# Patient Record
Sex: Female | Born: 1985 | Race: Black or African American | Hispanic: No | Marital: Single | Smoking: Never smoker
Health system: Southern US, Community
[De-identification: ages and names within clinical notes are randomized; demographics above are authoritative.]

## PROBLEM LIST (undated history)

## (undated) ENCOUNTER — Inpatient Hospital Stay (HOSPITAL_COMMUNITY): Payer: Self-pay

## (undated) DIAGNOSIS — Z789 Other specified health status: Secondary | ICD-10-CM

## (undated) HISTORY — PX: DILATION AND CURETTAGE OF UTERUS: SHX78

---

## 2009-07-06 ENCOUNTER — Emergency Department (HOSPITAL_COMMUNITY): Admission: EM | Admit: 2009-07-06 | Discharge: 2009-07-06 | Payer: Self-pay | Admitting: Emergency Medicine

## 2011-02-01 ENCOUNTER — Emergency Department (HOSPITAL_COMMUNITY)
Admission: EM | Admit: 2011-02-01 | Discharge: 2011-02-01 | Disposition: A | Discharge status: Home / Self Care | Payer: Medicaid Other | Attending: Emergency Medicine | Admitting: Emergency Medicine

## 2011-02-01 DIAGNOSIS — O219 Vomiting of pregnancy, unspecified: Secondary | ICD-10-CM | POA: Insufficient documentation

## 2011-02-01 LAB — URINALYSIS, ROUTINE W REFLEX MICROSCOPIC
Nitrite: NEGATIVE
Specific Gravity, Urine: 1.044 — ABNORMAL HIGH (ref 1.005–1.030)
pH: 6 (ref 5.0–8.0)

## 2011-02-01 LAB — POCT I-STAT, CHEM 8
BUN: 8 mg/dL (ref 6–23)
Calcium, Ion: 1.23 mmol/L (ref 1.12–1.32)
Chloride: 100 mEq/L (ref 96–112)
Creatinine, Ser: 0.7 mg/dL (ref 0.50–1.10)
Glucose, Bld: 75 mg/dL (ref 70–99)
HCT: 43 % (ref 36.0–46.0)
Hemoglobin: 14.6 g/dL (ref 12.0–15.0)
Potassium: 3.2 mEq/L — ABNORMAL LOW (ref 3.5–5.1)
Sodium: 135 mEq/L (ref 135–145)
TCO2: 24 mmol/L (ref 0–100)

## 2011-02-01 LAB — URINE MICROSCOPIC-ADD ON

## 2011-02-01 LAB — POCT PREGNANCY, URINE: Preg Test, Ur: POSITIVE

## 2011-02-03 ENCOUNTER — Emergency Department (HOSPITAL_COMMUNITY): Payer: Medicaid Other

## 2011-02-03 ENCOUNTER — Observation Stay (HOSPITAL_COMMUNITY)
Admission: EM | Admit: 2011-02-03 | Discharge: 2011-02-04 | Disposition: A | Discharge status: Home / Self Care | Payer: Medicaid Other | Attending: Emergency Medicine | Admitting: Emergency Medicine

## 2011-02-03 DIAGNOSIS — A499 Bacterial infection, unspecified: Secondary | ICD-10-CM | POA: Insufficient documentation

## 2011-02-03 DIAGNOSIS — O2390 Unspecified genitourinary tract infection in pregnancy, unspecified trimester: Secondary | ICD-10-CM | POA: Insufficient documentation

## 2011-02-03 DIAGNOSIS — B9689 Other specified bacterial agents as the cause of diseases classified elsewhere: Secondary | ICD-10-CM | POA: Insufficient documentation

## 2011-02-03 DIAGNOSIS — O210 Mild hyperemesis gravidarum: Principal | ICD-10-CM | POA: Insufficient documentation

## 2011-02-03 DIAGNOSIS — N760 Acute vaginitis: Secondary | ICD-10-CM | POA: Insufficient documentation

## 2011-02-03 LAB — POCT I-STAT, CHEM 8
Calcium, Ion: 1.18 mmol/L (ref 1.12–1.32)
Chloride: 102 mEq/L (ref 96–112)
Glucose, Bld: 59 mg/dL — ABNORMAL LOW (ref 70–99)
HCT: 36 % (ref 36.0–46.0)
Hemoglobin: 12.2 g/dL (ref 12.0–15.0)
TCO2: 17 mmol/L (ref 0–100)

## 2011-02-03 LAB — WET PREP, GENITAL
Trich, Wet Prep: NONE SEEN
Yeast Wet Prep HPF POC: NONE SEEN

## 2011-02-03 LAB — URINALYSIS, ROUTINE W REFLEX MICROSCOPIC
Bilirubin Urine: NEGATIVE
Hgb urine dipstick: NEGATIVE
Ketones, ur: >80 mg/dL — AB
Nitrite: NEGATIVE
Urobilinogen, UA: 1 mg/dL (ref 0.0–1.0)

## 2011-02-03 LAB — HCG, QUANTITATIVE, PREGNANCY: hCG, Beta Chain, Quant, S: 38378 m[IU]/mL — ABNORMAL HIGH (ref <5)

## 2011-02-04 LAB — GC/CHLAMYDIA PROBE AMP, GENITAL
Chlamydia, DNA Probe: NEGATIVE
GC Probe Amp, Genital: NEGATIVE

## 2011-04-21 NOTE — L&D Delivery Note (Signed)
Delivery Note At  a viable female was delivered via NSVD (Presentation: ROA ;  ).  APGAR: 9-9 , ; weight .   Placenta status: Intact , .  Cord: 3 vessel  with the following complications: None .  Cord pH: none  Anesthesia: Epidural  Episiotomy: None Lacerations: 2nd Degree Suture Repair: 2.0 chromic Est. Blood Loss (mL): 350  Mom to postpartum.  Baby to nursery-stable.  Salene Mohamud A 09/30/2011, 1:36 AM

## 2011-06-24 ENCOUNTER — Other Ambulatory Visit: Payer: Self-pay | Admitting: Obstetrics & Gynecology

## 2011-06-24 DIAGNOSIS — Z3689 Encounter for other specified antenatal screening: Secondary | ICD-10-CM

## 2011-06-24 DIAGNOSIS — O0930 Supervision of pregnancy with insufficient antenatal care, unspecified trimester: Secondary | ICD-10-CM

## 2011-06-24 LAB — CBC
HCT: 30 % — AB (ref 36–46)
Hemoglobin: 9.6 g/dL — AB (ref 12.0–16.0)
Platelets: 305 10*3/uL (ref 150–399)

## 2011-06-24 LAB — RUBELLA ANTIBODY, IGM: Rubella: IMMUNE

## 2011-06-24 LAB — GC/CHLAMYDIA PROBE AMP, GENITAL: Gonorrhea: NEGATIVE

## 2011-06-25 ENCOUNTER — Observation Stay (HOSPITAL_COMMUNITY)
Admission: AD | Admit: 2011-06-25 | Discharge: 2011-06-29 | Disposition: A | Discharge status: Home / Self Care | Payer: Medicaid Other | Source: Ambulatory Visit | Attending: Obstetrics | Admitting: Obstetrics

## 2011-06-25 ENCOUNTER — Encounter (HOSPITAL_COMMUNITY): Payer: Self-pay | Admitting: *Deleted

## 2011-06-25 ENCOUNTER — Other Ambulatory Visit: Payer: Self-pay | Admitting: Obstetrics & Gynecology

## 2011-06-25 ENCOUNTER — Ambulatory Visit (HOSPITAL_COMMUNITY)
Admission: RE | Admit: 2011-06-25 | Discharge: 2011-06-25 | Disposition: A | Discharge status: Home / Self Care | Payer: Medicaid Other | Source: Ambulatory Visit | Attending: Obstetrics & Gynecology | Admitting: Obstetrics & Gynecology

## 2011-06-25 DIAGNOSIS — A499 Bacterial infection, unspecified: Secondary | ICD-10-CM | POA: Insufficient documentation

## 2011-06-25 DIAGNOSIS — O4700 False labor before 37 completed weeks of gestation, unspecified trimester: Principal | ICD-10-CM | POA: Insufficient documentation

## 2011-06-25 DIAGNOSIS — B9689 Other specified bacterial agents as the cause of diseases classified elsewhere: Secondary | ICD-10-CM | POA: Insufficient documentation

## 2011-06-25 DIAGNOSIS — Z3689 Encounter for other specified antenatal screening: Secondary | ICD-10-CM

## 2011-06-25 DIAGNOSIS — O0930 Supervision of pregnancy with insufficient antenatal care, unspecified trimester: Secondary | ICD-10-CM

## 2011-06-25 DIAGNOSIS — O09219 Supervision of pregnancy with history of pre-term labor, unspecified trimester: Secondary | ICD-10-CM

## 2011-06-25 DIAGNOSIS — O358XX0 Maternal care for other (suspected) fetal abnormality and damage, not applicable or unspecified: Secondary | ICD-10-CM | POA: Insufficient documentation

## 2011-06-25 DIAGNOSIS — Z363 Encounter for antenatal screening for malformations: Secondary | ICD-10-CM | POA: Insufficient documentation

## 2011-06-25 DIAGNOSIS — O2390 Unspecified genitourinary tract infection in pregnancy, unspecified trimester: Secondary | ICD-10-CM | POA: Insufficient documentation

## 2011-06-25 DIAGNOSIS — Z1389 Encounter for screening for other disorder: Secondary | ICD-10-CM | POA: Insufficient documentation

## 2011-06-25 DIAGNOSIS — N760 Acute vaginitis: Secondary | ICD-10-CM | POA: Insufficient documentation

## 2011-06-25 HISTORY — DX: Other specified health status: Z78.9

## 2011-06-25 LAB — CBC
HCT: 30 % — ABNORMAL LOW (ref 36.0–46.0)
MCHC: 33 g/dL (ref 30.0–36.0)
MCV: 75.9 fL — ABNORMAL LOW (ref 78.0–100.0)
RDW: 13.1 % (ref 11.5–15.5)
WBC: 6.4 10*3/uL (ref 4.0–10.5)

## 2011-06-25 MED ORDER — ZOLPIDEM TARTRATE 10 MG PO TABS: 10.0000 mg | ORAL_TABLET | Freq: Every evening | ORAL | Status: DC | PRN

## 2011-06-25 MED ORDER — ACETAMINOPHEN 325 MG PO TABS: 650.0000 mg | ORAL_TABLET | ORAL | Status: DC | PRN

## 2011-06-25 MED ORDER — MAGNESIUM SULFATE 40 G IN LACTATED RINGERS - SIMPLE
4.0000 g | Freq: Once | INTRAVENOUS | Status: AC
  Administered 2011-06-25: 4 g via INTRAVENOUS
  Filled 2011-06-25: qty 500

## 2011-06-25 MED ORDER — GENTAMICIN SULFATE 40 MG/ML IJ SOLN
Freq: Three times a day (TID) | INTRAVENOUS | Status: AC
  Administered 2011-06-25 – 2011-06-28 (×10): via INTRAVENOUS
  Filled 2011-06-25 (×10): qty 5

## 2011-06-25 MED ORDER — PRENATAL MULTIVITAMIN CH
1.0000 | ORAL_TABLET | Freq: Every day | ORAL | Status: DC
  Administered 2011-06-25 – 2011-06-27 (×3): 1 via ORAL
  Filled 2011-06-25 (×3): qty 1

## 2011-06-25 MED ORDER — DOCUSATE SODIUM 100 MG PO CAPS
100.0000 mg | ORAL_CAPSULE | Freq: Every day | ORAL | Status: DC
  Administered 2011-06-25 – 2011-06-27 (×3): 100 mg via ORAL
  Filled 2011-06-25 (×3): qty 1

## 2011-06-25 MED ORDER — CLINDAMYCIN PHOSPHATE 900 MG/50ML IV SOLN: 900.0000 mg | Freq: Three times a day (TID) | INTRAVENOUS | Status: DC

## 2011-06-25 MED ORDER — LACTATED RINGERS IV SOLN
INTRAVENOUS | Status: DC
  Administered 2011-06-25 – 2011-06-27 (×6): via INTRAVENOUS
  Administered 2011-06-28: 125 mL/h via INTRAVENOUS
  Administered 2011-06-29: 03:00:00 via INTRAVENOUS

## 2011-06-25 MED ORDER — CALCIUM CARBONATE ANTACID 500 MG PO CHEW: 2.0000 | CHEWABLE_TABLET | ORAL | Status: DC | PRN

## 2011-06-25 MED ORDER — MAGNESIUM SULFATE 40 G IN LACTATED RINGERS - SIMPLE
2.0000 g/h | INTRAVENOUS | Status: DC
  Administered 2011-06-26 – 2011-06-27 (×2): 2 g/h via INTRAVENOUS
  Filled 2011-06-25 (×3): qty 500

## 2011-06-25 NOTE — ED Provider Notes (Signed)
See Admission H&P for short cx/funnelling at [redacted] wks GA

## 2011-06-25 NOTE — Progress Notes (Signed)
ANTIBIOTIC CONSULT NOTE - INITIAL  Pharmacy Consult for Gentamicin Indication:  PTL r/o infection  No Known Allergies  Patient Measurements: Height: 5\' 11"  (180.3 cm) Weight: 178 lb (80.74 kg) IBW/kg (Calculated) : 70.8  Dosing weight:  80.74 kg  Vital Signs: Temp: 98.5 F (36.9 C) (03/07 1243) Temp src: Oral (03/07 1243) BP: 121/71 mmHg (03/07 1243) Pulse Rate: 76  (03/07 1243)  Labs: No SCr available.  Will assume normal renal function and use SCr 0.7 mg/dL and an estimated CrCL >454 mL/min.  Medications:  Clindamycin 900 mg IV q8h  Assessment: 25yoF at [redacted] weeks gestation with PTL. Estimated Ke = 0.413, Vd = 28.2 L  Goal of Therapy:  Gentamicin peak 6-8 mg/L and Trough < 1 mg/L  Plan:  Gentamicin 200 mg IV every 8 hrs  Check Scr with next labs if gentamicin continued. Will check gentamicin levels if continued > 72hr or clinically indicated.  Clearnce Sorrel 06/25/2011,2:49 PM

## 2011-06-25 NOTE — H&P (Signed)
Kelly Velez is a 26 y.o. female presenting for further evaluation and treatment of threatened PTL. Maternal Medical History:  Reason for admission: Sent from [place] where she was having routine anatomic screen and noted to have short cx and funnelling of membranes.  Fetal activity: Perceived fetal activity is normal.    Prenatal complications: Late to care with NOB visit 2 days ago. States she was diagnosed with BV but has not yet filled Rx.  Prenatal Complications - Diabetes: none.    OB History    Grav Para Term Preterm Abortions TAB SAB Ect Mult Living   2 0 0 0 1 1 0 0 0 0      Past Medical History  Diagnosis Date  . No pertinent past medical history    Past Surgical History  Procedure Date  . Dilation and curettage of uterus    Family History: family history is negative for Anesthesia problems. Social History:  reports that she has never smoked. She does not have any smokeless tobacco history on file. She reports that she does not drink alcohol or use illicit drugs.  Review of Systems  Constitutional: Negative.   Eyes: Negative for blurred vision.  Respiratory: Negative for cough.   Cardiovascular: Negative for chest pain.  Gastrointestinal: Negative for vomiting.  Genitourinary: Negative for dysuria.  Skin: Negative for rash.  Neurological: Negative for headaches.  Psychiatric/Behavioral: Negative for depression.    Dilation: Closed Effacement (%): 30 Station: -3 Exam by:: D. Joelys Staubs, CNM Blood pressure 121/71, pulse 76, temperature 98.5 F (36.9 C), temperature source Oral, resp. rate 18, height 5\' 11"  (1.803 m), weight 80.74 kg (178 lb). Maternal Exam:  Uterine Assessment: Contraction strength is mild.  Contraction duration is 30 seconds. Contraction frequency is rare.   Abdomen: Fundal height is at costal margin.   Fetal presentation: vertex  Introitus: Normal vulva. Normal vagina.  Ferning test: not done.  Nitrazine test: not done.  Pelvis: adequate for  delivery.   Cervix: Cervix evaluated by digital exam.   Posterior, closed 2.5 cm long, high  Fetal Exam Fetal Monitor Review: Mode: ultrasound.   Variability: moderate (6-25 bpm).   Pattern: accelerations present and no decelerations.    Fetal State Assessment: Category I - tracings are normal.     Physical Exam  Constitutional: She is oriented to person, place, and time. She appears well-developed and well-nourished. No distress.  HENT:  Head: Normocephalic.  Eyes: Pupils are equal, round, and reactive to light.  Neck: Neck supple. No thyromegaly present.  Cardiovascular: Normal rate, regular rhythm and normal heart sounds.   Respiratory: Effort normal and breath sounds normal. No respiratory distress.  GI: She exhibits no distension. There is no tenderness.  Genitourinary: Vagina normal and uterus normal.  Musculoskeletal: Normal range of motion. She exhibits no edema and no tenderness.  Neurological: She is alert and oriented to person, place, and time.  Skin: Skin is warm and dry.  Psychiatric: She has a normal mood and affect.     Prenatal labs: done at [facility] ABO, Rh:   Antibody:   Rubella:   RPR:    HBsAg:    HIV:    GBS:     Assessment/Plan: Essential primigravida with shortened cx/funnelling. BV by hx Consulted Dr. Clearance Coots who plans to admit to AN Unit   Kelly Velez 06/25/2011, 1:48 PM

## 2011-06-25 NOTE — Progress Notes (Signed)
Pt sent over from ultrasound due to shortened cervix of 1.5 cm.  Pt denies any abdominal pain or bleeding.  + FM.

## 2011-06-26 LAB — BASIC METABOLIC PANEL
BUN: 6 mg/dL (ref 6–23)
CO2: 23 mEq/L (ref 19–32)
GFR calc non Af Amer: >90 mL/min (ref >90)
Glucose, Bld: 92 mg/dL (ref 70–99)
Potassium: 3.3 mEq/L — ABNORMAL LOW (ref 3.5–5.1)

## 2011-06-26 NOTE — Progress Notes (Signed)
Patient ID: Kelly Velez, female   DOB: Sep 16, 1985, 26 y.o.   MRN: [id number] Hospital Day # 1  S: Preterm labor symptoms: none.  Ultrasound reveals funneling of cervix down to ~ 1cm of measurable cervix.  O: Blood pressure 95/56, pulse 88, temperature 98.6 F (37 C), temperature source Oral, resp. rate 18, height 5\' 11"  (1.803 m), weight 178 lb (80.74 kg).   YQM:VHQIONGE: 150 bpm Toco: None XBM:[id number]: Closed Effacement (%): 30 Cervical Position: Posterior Station: -3 Exam by:: D. Poe, CNM  A/P- 27 y.o. admitted with:  Preterm cervical changes on ultrasound,  but no uterine contractions.  Patient Active Hospital Problem List: No active hospital problems.   Pregnancy Complications: preterm labor  with preterm cervical changes Preterm labor management: IV D5LR started, pelvic rest advised, modified bedrest advised, discontinue work, preterm labor education provided and magnesium sulfate prophylaxis,  and antibiotic prophylaxis x 72 hours.   Dating:  [redacted]w[redacted]d PNL Needed:  none FWB:  good PTL:  stable ROD: n/a

## 2011-06-26 NOTE — H&P (Signed)
Kelly Velez is a 26 y.o. female presenting for ultrasound evaluation and had preterm cervical changes on ultrasound.. Maternal Medical History:  Fetal activity: Perceived fetal activity is normal.   Last perceived fetal movement was within the past hour.    Prenatal Complications - Diabetes: none.    OB History    Grav Para Term Preterm Abortions TAB SAB Ect Mult Living   3 0 0 0 2 2 0 0 0 0      Past Medical History  Diagnosis Date  . No pertinent past medical history    Past Surgical History  Procedure Date  . Dilation and curettage of uterus    Family History: family history is negative for Anesthesia problems. Social History:  reports that she has never smoked. She does not have any smokeless tobacco history on file. She reports that she does not drink alcohol or use illicit drugs.  Review of Systems  All other systems reviewed and are negative.    Dilation: Closed Effacement (%): 30 Station: -3 Exam by:: D. Poe, CNM Blood pressure 95/56, pulse 88, temperature 98.6 F (37 C), temperature source Oral, resp. rate 18, height 5\' 11"  (1.803 m), weight 178 lb (80.74 kg). Maternal Exam:  Abdomen: Patient reports no abdominal tenderness. Introitus: Normal vulva. Normal vagina.  Cervix: Cervix evaluated by digital exam.     Physical Exam  Nursing note and vitals reviewed. Constitutional: She is oriented to person, place, and time. She appears well-developed and well-nourished.  HENT:  Head: Normocephalic and atraumatic.  Eyes: Conjunctivae are normal. Pupils are equal, round, and reactive to light.  Neck: Normal range of motion. Neck supple.  Cardiovascular: Normal rate and regular rhythm.   Respiratory: Effort normal.  GI: Soft.  Genitourinary: Vagina normal and uterus normal.  Musculoskeletal: Normal range of motion.  Neurological: She is alert and oriented to person, place, and time.  Skin: Skin is warm and dry.  Psychiatric: She has a normal mood and affect. Her  behavior is normal. Judgment and thought content normal.    Prenatal labs: ABO, Rh: A/Positive/-- (03/05 1700) Antibody: Negative (03/05 1700) Rubella: Immune (03/06 0247) RPR: Nonreactive (03/06 0247)  HBsAg: Negative (03/06 0247)  HIV: Non-reactive (03/06 0247)  GBS:     Assessment/Plan: 27 weeks.  Preterm cervical changes on ultrasound with funneling down to ~ 1cm during realtime scan.  Admit.  Magnesium sulfate and Gent./Clinda. Prophylaxis x 72 hours.  Repeat ultrasound for cervical measurement on 06-29-11.  Cordaryl Decelles A 06/26/2011, 1:37 PM

## 2011-06-26 NOTE — Progress Notes (Signed)
UR Chart review completed.  

## 2011-06-27 NOTE — Progress Notes (Signed)
Patient ID: Kelly Velez, female   DOB: Mar 03, 1986, 26 y.o.   MRN: [id number] Vital signs normal No contractions status unchanged

## 2011-06-28 NOTE — Progress Notes (Signed)
Time changed to daylight savings time 

## 2011-06-28 NOTE — Progress Notes (Signed)
Patient ID: Kelly Velez, female   DOB: June 29, 1985, 26 y.o.   MRN: [id number] Postpartum day one Vital signs normal Fundus firm Lochia moderate Legs negative  Doing well

## 2011-06-29 MED ORDER — PROGESTERONE MICRONIZED 200 MG PO CAPS: 200.0000 mg | ORAL_CAPSULE | Freq: Every day | ORAL | Status: DC

## 2011-06-29 MED ORDER — DSS 100 MG PO CAPS: 100.0000 mg | ORAL_CAPSULE | Freq: Every day | ORAL | Status: DC

## 2011-06-29 NOTE — Discharge Summary (Signed)
Physician Discharge Summary  Patient ID: Kelly Velez MRN: [id number] DOB/AGE: 1985/06/01 26 y.o.  Admit date: 06/25/2011 Discharge date: 06/29/2011  Admission Diagnoses:  Discharge Diagnoses:  Active Problems:  * No active hospital problems. *    Discharged Condition: good  Hospital Course: Admitted for preterm cervical changes.  Responded well to bedrest.  Consults: None  Significant Diagnostic Studies: radiology: Ultrasound: Cervical funneling to ~ 1 cm.  Treatments: IV hydration and bedrest.  Discharge Exam: Blood pressure 104/56, pulse 72, temperature 98.7 F (37.1 C), temperature source Oral, resp. rate 18, height 5\' 11"  (1.803 m), weight 178 lb (80.74 kg), SpO2 100.00%. Cervix:  FT/50%  Disposition: 01-Home or Self Care  Discharge Orders    Future Orders Please Complete By Expires   Discharge instructions      Comments:   PTL precautions.   PRETERM LABOR:  Includes any of the follwing symptoms that occur between 20 - [redacted] weeks gestation.  If these symptoms are not stopped, preterm labor can result in preterm delivery, placing your baby at risk      Notify physician for menstrual like cramps      Notify physician for uterine contractions.  These may be painless and feel like the uterus is tightening or the baby is  "balling up"      Notify physician for low, dull backache, unrelieved by heat or Tylenol      Notify physician for intestinal cramps, with or without diarrhea, sometimes described as "gas pain"      Notify physician for pelvic pressure      Notify physician for increase or change in vaginal discharge      Notify physician for vaginal bleeding      Notify physician for a general feeling that "something is not right"      Notify physician for leaking of fluid      Discharge activity:  Up to eat      Discharge diet:      Do not have sex or do anything that might make you have an orgasm      Discharge patient      HIV antibody      Comments:   This  external order was created through the Results Console.   GC/chlamydia probe amp, genital      Comments:   This external order was created through the Results Console.   CBC      Comments:   This external order was created through the Results Console.   Rubella antibody, IgM      Comments:   This external order was created through the Results Console.   Hepatitis B surface antigen      Comments:   This external order was created through the Results Console.   RPR      Comments:   This external order was created through the Results Console.   Antibody screen      Comments:   This external order was created through the Results Console.   ABO/Rh      Comments:   This external order was created through the Results Console.     Medication List  As of 06/29/2011  8:48 AM   TAKE these medications         DSS 100 MG Caps   Take 100 mg by mouth daily.      prenatal multivitamin Tabs   Take 1 tablet by mouth daily.      progesterone 200 MG capsule  Commonly known as: PROMETRIUM   Take 1 capsule (200 mg total) by mouth daily. Per vagina qhs.           Follow-up Information    Schedule an appointment as soon as possible for a visit with Roseanna Rainbow, MD.   Contact information:   [address], [address] [place] [address] [phone]          Signed: Brock Bad 06/29/2011, 8:48 AM

## 2011-06-29 NOTE — Progress Notes (Signed)
Patient ID: Kelly Velez, female   DOB: 01-Oct-1985, 26 y.o.   MRN: [id number] Hospital Day: 5  S: Preterm labor symptoms: preterm cervical changes.  O: Blood pressure 106/54, pulse 78, temperature 98.7 F (37.1 C), temperature source Oral, resp. rate 18, height 5\' 11"  (1.803 m), weight 178 lb (80.74 kg), SpO2 100.00%.   WUJ:WJXBJYNW: 150 bpm Toco: None GNF:AOZHYQMV: Closed Effacement (%): 30 Cervical Position: Posterior Station: -3 Exam by:: D. Poe, CNM  A/P- 26 y.o. admitted with: Preterm cervical changes.  Discharge home.  Continue with progesterone vaginal suppositories. Patient Active Hospital Problem List: No active hospital problems.   Pregnancy Complications: preterm labor   Preterm labor management: pelvic rest advised, modified bedrest advised, discontinue work, preterm labor education provided, return to office weekly for follow-up and progesterone suppositories. Dating:  [redacted]w[redacted]d PNL Needed:  none FWB:  good PTL:  stable

## 2011-09-24 ENCOUNTER — Other Ambulatory Visit: Payer: Self-pay | Admitting: Obstetrics

## 2011-09-24 ENCOUNTER — Telehealth (HOSPITAL_COMMUNITY): Payer: Self-pay | Admitting: *Deleted

## 2011-09-24 NOTE — Telephone Encounter (Signed)
Preadmission screen  

## 2011-09-25 ENCOUNTER — Telehealth (HOSPITAL_COMMUNITY): Payer: Self-pay | Admitting: *Deleted

## 2011-09-25 NOTE — Telephone Encounter (Signed)
Preadmission screen  

## 2011-09-29 ENCOUNTER — Encounter (HOSPITAL_COMMUNITY): Payer: Self-pay

## 2011-09-29 ENCOUNTER — Inpatient Hospital Stay (HOSPITAL_COMMUNITY)
Admission: RE | Admit: 2011-09-29 | Discharge: 2011-10-02 | DRG: 775 | Disposition: A | Discharge status: Home / Self Care | Payer: Medicaid Other | Source: Ambulatory Visit | Attending: Obstetrics | Admitting: Obstetrics

## 2011-09-29 ENCOUNTER — Encounter (HOSPITAL_COMMUNITY): Payer: Self-pay | Admitting: Anesthesiology

## 2011-09-29 ENCOUNTER — Inpatient Hospital Stay (HOSPITAL_COMMUNITY): Payer: Medicaid Other | Admitting: Anesthesiology

## 2011-09-29 DIAGNOSIS — O480 Post-term pregnancy: Principal | ICD-10-CM | POA: Diagnosis present

## 2011-09-29 LAB — CBC
Hemoglobin: 9 g/dL — ABNORMAL LOW (ref 12.0–15.0)
MCH: 20.9 pg — ABNORMAL LOW (ref 26.0–34.0)
MCV: 64.7 fL — ABNORMAL LOW (ref 78.0–100.0)
RBC: 4.3 MIL/uL (ref 3.87–5.11)

## 2011-09-29 MED ORDER — LIDOCAINE HCL (PF) 1 % IJ SOLN
INTRAMUSCULAR | Status: DC | PRN
  Administered 2011-09-29 (×2): 9 mL

## 2011-09-29 MED ORDER — NALBUPHINE HCL 10 MG/ML IJ SOLN
10.0000 mg | INTRAMUSCULAR | Status: DC | PRN
  Filled 2011-09-29: qty 1

## 2011-09-29 MED ORDER — BUTORPHANOL TARTRATE 2 MG/ML IJ SOLN: 1.0000 mg | INTRAMUSCULAR | Status: DC | PRN

## 2011-09-29 MED ORDER — EPHEDRINE 5 MG/ML INJ: 10.0000 mg | INTRAVENOUS | Status: DC | PRN

## 2011-09-29 MED ORDER — OXYTOCIN 10 UNIT/ML IJ SOLN
40.0000 [IU] | Freq: Once | INTRAVENOUS | Status: DC
  Filled 2011-09-29: qty 4

## 2011-09-29 MED ORDER — MISOPROSTOL 25 MCG QUARTER TABLET: 25.0000 ug | ORAL_TABLET | ORAL | Status: DC | PRN

## 2011-09-29 MED ORDER — PHENYLEPHRINE 40 MCG/ML (10ML) SYRINGE FOR IV PUSH (FOR BLOOD PRESSURE SUPPORT): 80.0000 ug | PREFILLED_SYRINGE | INTRAVENOUS | Status: DC | PRN

## 2011-09-29 MED ORDER — TERBUTALINE SULFATE 1 MG/ML IJ SOLN: 0.2500 mg | Freq: Once | INTRAMUSCULAR | Status: AC | PRN

## 2011-09-29 MED ORDER — OXYCODONE-ACETAMINOPHEN 5-325 MG PO TABS: 1.0000 | ORAL_TABLET | ORAL | Status: DC | PRN

## 2011-09-29 MED ORDER — LIDOCAINE HCL (PF) 1 % IJ SOLN
30.0000 mL | INTRAMUSCULAR | Status: DC | PRN
  Administered 2011-09-30: 30 mL via SUBCUTANEOUS
  Filled 2011-09-29: qty 30

## 2011-09-29 MED ORDER — PROMETHAZINE HCL 25 MG/ML IJ SOLN: 25.0000 mg | Freq: Four times a day (QID) | INTRAMUSCULAR | Status: DC | PRN

## 2011-09-29 MED ORDER — FENTANYL 2.5 MCG/ML BUPIVACAINE 1/10 % EPIDURAL INFUSION (WH - ANES)
14.0000 mL/h | INTRAMUSCULAR | Status: DC
  Filled 2011-09-29: qty 60

## 2011-09-29 MED ORDER — HYDROXYZINE HCL 50 MG/ML IM SOLN
50.0000 mg | Freq: Four times a day (QID) | INTRAMUSCULAR | Status: DC | PRN
  Filled 2011-09-29: qty 1

## 2011-09-29 MED ORDER — EPHEDRINE 5 MG/ML INJ
10.0000 mg | INTRAVENOUS | Status: DC | PRN
  Filled 2011-09-29: qty 4

## 2011-09-29 MED ORDER — ONDANSETRON HCL 4 MG/2ML IJ SOLN: 4.0000 mg | Freq: Four times a day (QID) | INTRAMUSCULAR | Status: DC | PRN

## 2011-09-29 MED ORDER — PHENYLEPHRINE 40 MCG/ML (10ML) SYRINGE FOR IV PUSH (FOR BLOOD PRESSURE SUPPORT)
80.0000 ug | PREFILLED_SYRINGE | INTRAVENOUS | Status: DC | PRN
  Filled 2011-09-29: qty 5

## 2011-09-29 MED ORDER — OXYTOCIN BOLUS FROM INFUSION
500.0000 mL | Freq: Once | INTRAVENOUS | Status: DC
  Filled 2011-09-29: qty 500

## 2011-09-29 MED ORDER — OXYTOCIN 20 UNITS IN LACTATED RINGERS INFUSION - SIMPLE: 125.0000 mL/h | Freq: Once | INTRAVENOUS | Status: DC

## 2011-09-29 MED ORDER — TERBUTALINE SULFATE 1 MG/ML IJ SOLN: 0.2500 mg | Freq: Once | INTRAMUSCULAR | Status: DC | PRN

## 2011-09-29 MED ORDER — DIPHENHYDRAMINE HCL 50 MG/ML IJ SOLN: 12.5000 mg | INTRAMUSCULAR | Status: DC | PRN

## 2011-09-29 MED ORDER — IBUPROFEN 600 MG PO TABS: 600.0000 mg | ORAL_TABLET | Freq: Four times a day (QID) | ORAL | Status: DC | PRN

## 2011-09-29 MED ORDER — MISOPROSTOL 25 MCG QUARTER TABLET
25.0000 ug | ORAL_TABLET | ORAL | Status: DC | PRN
  Administered 2011-09-29 (×3): 25 ug via VAGINAL
  Filled 2011-09-29 (×3): qty 0.25

## 2011-09-29 MED ORDER — FENTANYL 2.5 MCG/ML BUPIVACAINE 1/10 % EPIDURAL INFUSION (WH - ANES)
INTRAMUSCULAR | Status: DC | PRN
  Administered 2011-09-29: 14 mL/h via EPIDURAL

## 2011-09-29 MED ORDER — NALBUPHINE HCL 10 MG/ML IJ SOLN
10.0000 mg | Freq: Four times a day (QID) | INTRAMUSCULAR | Status: DC | PRN
  Filled 2011-09-29: qty 1

## 2011-09-29 MED ORDER — OXYTOCIN 20 UNITS IN LACTATED RINGERS INFUSION - SIMPLE: 125.0000 mL/h | INTRAVENOUS | Status: DC

## 2011-09-29 MED ORDER — LACTATED RINGERS IV SOLN: 500.0000 mL | Freq: Once | INTRAVENOUS | Status: DC

## 2011-09-29 MED ORDER — HYDROXYZINE HCL 50 MG PO TABS
50.0000 mg | ORAL_TABLET | Freq: Four times a day (QID) | ORAL | Status: DC | PRN
  Filled 2011-09-29: qty 1

## 2011-09-29 MED ORDER — LACTATED RINGERS IV SOLN: 500.0000 mL | INTRAVENOUS | Status: DC | PRN

## 2011-09-29 MED ORDER — LACTATED RINGERS IV SOLN: INTRAVENOUS | Status: DC

## 2011-09-29 MED ORDER — ACETAMINOPHEN 325 MG PO TABS: 650.0000 mg | ORAL_TABLET | ORAL | Status: DC | PRN

## 2011-09-29 MED ORDER — CITRIC ACID-SODIUM CITRATE 334-500 MG/5ML PO SOLN: 30.0000 mL | ORAL | Status: DC | PRN

## 2011-09-29 NOTE — H&P (Signed)
Kelly Velez is a 26 y.o. female presenting for IOL for postdates. Maternal Medical History:  Reason for admission: G3 P0 at 40.5 weeks.  Presents for IOL for postdates.  Fetal activity: Perceived fetal activity is normal.   Last perceived fetal movement was within the past hour.    Prenatal complications: no prenatal complications Prenatal Complications - Diabetes: none.    OB History    Grav Para Term Preterm Abortions TAB SAB Ect Mult Living   3 0 0 0 2 2 0 0 0 0      Past Medical History  Diagnosis Date  . No pertinent past medical history    Past Surgical History  Procedure Date  . Dilation and curettage of uterus    Family History: family history is negative for Anesthesia problems. Social History:  reports that she has never smoked. She does not have any smokeless tobacco history on file. She reports that she does not drink alcohol or use illicit drugs.  Review of Systems  All other systems reviewed and are negative.      There were no vitals taken for this visit. Maternal Exam:  Abdomen: Patient reports no abdominal tenderness. Fetal presentation: vertex  Pelvis: adequate for delivery.   Cervix: Cervix evaluated by digital exam.     Physical Exam  Nursing note and vitals reviewed. Constitutional: She is oriented to person, place, and time. She appears well-developed and well-nourished.  HENT:  Head: Normocephalic and atraumatic.  Eyes: Pupils are equal, round, and reactive to light.  Neck: Normal range of motion. Neck supple.  Cardiovascular: Normal rate and regular rhythm.   Respiratory: Effort normal.  GI: Soft.  Musculoskeletal: Normal range of motion.  Neurological: She is alert and oriented to person, place, and time.  Skin: Skin is warm and dry.  Psychiatric: She has a normal mood and affect. Her behavior is normal. Judgment and thought content normal.    Prenatal labs: ABO, Rh: A/Positive/-- (03/05 1700) Antibody: Negative (03/05  1700) Rubella: Immune (03/06 0247) RPR: Nonreactive (03/06 0247)  HBsAg: Negative (03/06 0247)  HIV: Non-reactive (03/06 0247)  GBS: Negative (05/16 0000)   Assessment/Plan: 40.5 weeks.  2 stage IOL.   Shaylon Gillean A 09/29/2011, 8:38 AM

## 2011-09-29 NOTE — Anesthesia Preprocedure Evaluation (Signed)

## 2011-09-29 NOTE — Progress Notes (Signed)
Kelly Velez is a 26 y.o. G3P0020 at [redacted]w[redacted]d by LMP admitted for induction of labor due to Post dates. Due date 09-24-11.  Subjective:   Objective: BP 121/76  Pulse 66  Temp(Src) 97.6 F (36.4 C) (Oral)  Resp 18  Ht 5\' 10"  (1.778 m)  Wt 92.08 kg (203 lb)  BMI 29.13 kg/m2      FHT:  FHR: 150 bpm, variability: moderate,  accelerations:  Present,  decelerations:  Absent UC:   regular, every 3 minutes SVE:   Dilation: 9 Effacement (%): 100 Station: 0;+1 Exam by:: GPayne, RN  Labs: Lab Results  Component Value Date   WBC 6.7 09/29/2011   HGB 9.0* 09/29/2011   HCT 27.8* 09/29/2011   MCV 64.7* 09/29/2011   PLT 325 09/29/2011    Assessment / Plan: Induction of labor due to postdates,  progressing well on pitocin  Labor: Progressing on Pitocin, will continue to increase then AROM Preeclampsia:  n/a Fetal Wellbeing:  Category I Pain Control:  Epidural I/D:  n/a Anticipated MOD:  NSVD  Kelly Velez A 09/29/2011, 11:20 PM

## 2011-09-29 NOTE — Progress Notes (Signed)
Kelly Velez is a 26 y.o. G3P0020 at [redacted]w[redacted]d by LMP admitted for induction of labor due to Post dates. Due date 09-24-11.  Subjective:   Objective: BP 116/70  Pulse 89  Temp(Src) 98.8 F (37.1 C) (Oral)  Resp 18  Ht 5\' 10"  (1.778 m)  Wt 92.08 kg (203 lb)  BMI 29.13 kg/m2      FHT:  FHR: 150 bpm, variability: moderate,  accelerations:  Present,  decelerations:  Absent UC:   regular, every 3 minutes SVE:   Dilation: Fingertip Effacement (%): 50 Station: -3 Exam by:: lee  Labs: Lab Results  Component Value Date   WBC 6.7 09/29/2011   HGB 9.0* 09/29/2011   HCT 27.8* 09/29/2011   MCV 64.7* 09/29/2011   PLT 325 09/29/2011    Assessment / Plan: Spontaneous labor, progressing normally  Labor: Progressing normally Preeclampsia:  n/a Fetal Wellbeing:  Category I Pain Control:  Labor support without medications I/D:  n/a Anticipated MOD:  NSVD  Kelly Velez A 09/29/2011, 11:14 AM

## 2011-09-29 NOTE — Anesthesia Procedure Notes (Signed)
Epidural Patient location during procedure: OB Start time: 09/29/2011 10:31 PM End time: 09/29/2011 10:36 PM Reason for block: procedure for pain  Staffing Anesthesiologist: Sandrea Hughs Performed by: anesthesiologist   Preanesthetic Checklist Completed: patient identified, site marked, surgical consent, pre-op evaluation, timeout performed, IV checked, risks and benefits discussed and monitors and equipment checked  Epidural Patient position: sitting Prep: site prepped and draped and DuraPrep Patient monitoring: continuous pulse ox and blood pressure Approach: midline Injection technique: LOR air  Needle:  Needle type: Tuohy  Needle gauge: 17 G Needle length: 9 cm Needle insertion depth: 7 cm Catheter type: closed end flexible Catheter size: 19 Gauge Catheter at skin depth: 12 cm Test dose: negative and Other  Assessment Sensory level: T10 Events: blood not aspirated, injection not painful, no injection resistance, negative IV test and no paresthesia

## 2011-09-30 ENCOUNTER — Encounter (HOSPITAL_COMMUNITY): Payer: Self-pay

## 2011-09-30 MED ORDER — SIMETHICONE 80 MG PO CHEW: 80.0000 mg | CHEWABLE_TABLET | ORAL | Status: DC | PRN

## 2011-09-30 MED ORDER — PRENATAL MULTIVITAMIN CH: 1.0000 | ORAL_TABLET | Freq: Every day | ORAL | Status: DC

## 2011-09-30 MED ORDER — ZOLPIDEM TARTRATE 5 MG PO TABS: 5.0000 mg | ORAL_TABLET | Freq: Every evening | ORAL | Status: DC | PRN

## 2011-09-30 MED ORDER — IBUPROFEN 600 MG PO TABS
600.0000 mg | ORAL_TABLET | Freq: Four times a day (QID) | ORAL | Status: DC
  Filled 2011-09-30: qty 1

## 2011-09-30 MED ORDER — SENNOSIDES-DOCUSATE SODIUM 8.6-50 MG PO TABS
2.0000 | ORAL_TABLET | Freq: Every day | ORAL | Status: DC
  Administered 2011-09-30 – 2011-10-01 (×2): 2 via ORAL

## 2011-09-30 MED ORDER — LANOLIN HYDROUS EX OINT: TOPICAL_OINTMENT | CUTANEOUS | Status: DC | PRN

## 2011-09-30 MED ORDER — WITCH HAZEL-GLYCERIN EX PADS: 1.0000 "application " | MEDICATED_PAD | CUTANEOUS | Status: DC | PRN

## 2011-09-30 MED ORDER — IBUPROFEN 100 MG/5ML PO SUSP
600.0000 mg | Freq: Four times a day (QID) | ORAL | Status: DC
  Administered 2011-09-30 – 2011-10-02 (×9): 600 mg via ORAL
  Filled 2011-09-30 (×15): qty 30

## 2011-09-30 MED ORDER — BENZOCAINE-MENTHOL 20-0.5 % EX AERO
1.0000 "application " | INHALATION_SPRAY | CUTANEOUS | Status: DC | PRN
  Filled 2011-09-30: qty 56

## 2011-09-30 MED ORDER — ONDANSETRON HCL 4 MG PO TABS: 4.0000 mg | ORAL_TABLET | ORAL | Status: DC | PRN

## 2011-09-30 MED ORDER — DIPHENHYDRAMINE HCL 25 MG PO CAPS: 25.0000 mg | ORAL_CAPSULE | Freq: Four times a day (QID) | ORAL | Status: DC | PRN

## 2011-09-30 MED ORDER — DIBUCAINE 1 % RE OINT: 1.0000 "application " | TOPICAL_OINTMENT | RECTAL | Status: DC | PRN

## 2011-09-30 MED ORDER — ONDANSETRON HCL 4 MG/2ML IJ SOLN: 4.0000 mg | INTRAMUSCULAR | Status: DC | PRN

## 2011-09-30 MED ORDER — TETANUS-DIPHTH-ACELL PERTUSSIS 5-2.5-18.5 LF-MCG/0.5 IM SUSP
0.5000 mL | Freq: Once | INTRAMUSCULAR | Status: AC
  Administered 2011-10-01: 0.5 mL via INTRAMUSCULAR

## 2011-09-30 MED ORDER — MEDROXYPROGESTERONE ACETATE 150 MG/ML IM SUSP: 150.0000 mg | INTRAMUSCULAR | Status: DC | PRN

## 2011-09-30 MED ORDER — OXYCODONE-ACETAMINOPHEN 5-325 MG PO TABS: 1.0000 | ORAL_TABLET | ORAL | Status: DC | PRN

## 2011-09-30 MED ORDER — OXYTOCIN 20 UNITS IN LACTATED RINGERS INFUSION - SIMPLE: 125.0000 mL/h | INTRAVENOUS | Status: DC | PRN

## 2011-09-30 MED ORDER — COMPLETENATE 29-1 MG PO CHEW
1.0000 | CHEWABLE_TABLET | Freq: Every day | ORAL | Status: DC
  Administered 2011-09-30 – 2011-10-02 (×3): 1 via ORAL
  Filled 2011-09-30 (×4): qty 1

## 2011-09-30 NOTE — Progress Notes (Signed)
Post Partum Day 0 Subjective: no complaints, up ad lib and tolerating PO  Objective: Blood pressure 133/71, pulse 76, temperature 99.2 F (37.3 C), temperature source Oral, resp. rate 20, height 5\' 10"  (1.778 m), weight 92.08 kg (203 lb), unknown if currently breastfeeding.  Physical Exam:  General: alert and no distress Lochia: appropriate Uterine Fundus: firm Incision: healing well DVT Evaluation: No evidence of DVT seen on physical exam.   Basename 09/29/11 0830  HGB 9.0*  HCT 27.8*    Assessment/Plan: Plan for discharge tomorrow   LOS: 1 day   Deloy Archey A 09/30/2011, 7:58 AM

## 2011-09-30 NOTE — Progress Notes (Signed)
Variable deceleration for 5 minutes with with FHR ranging between 66 and 120. Notified Dr. Clearance Coots.

## 2011-09-30 NOTE — Anesthesia Postprocedure Evaluation (Signed)
Anesthesia Post Note  Patient: Kelly Velez  Procedure(s) Performed: * No procedures listed *  Anesthesia type: Epidural  Patient location: Mother/Baby  Post pain: Pain level controlled  Post assessment: Post-op Vital signs reviewed  Last Vitals:  Filed Vitals:   09/30/11 1630  BP: 120/74  Pulse: 66  Temp: 36.8 C  Resp: 18    Post vital signs: Reviewed  Level of consciousness: awake  Complications: No apparent anesthesia complications

## 2011-10-01 NOTE — Progress Notes (Signed)
Post Partum Day 1 Subjective: no complaints  Objective: Blood pressure 116/73, pulse 67, temperature 97.9 F (36.6 C), temperature source Oral, resp. rate 20, height 5\' 10"  (1.778 m), weight 92.08 kg (203 lb), SpO2 99.00%, unknown if currently breastfeeding.  Physical Exam:  General: alert and no distress Lochia: appropriate Uterine Fundus: firm Incision: healing well DVT Evaluation: No evidence of DVT seen on physical exam.   Basename 09/29/11 0830  HGB 9.0*  HCT 27.8*    Assessment/Plan: Plan for discharge tomorrow   LOS: 2 days   Roopa Graver A 10/01/2011, 8:37 AM

## 2011-10-01 NOTE — Clinical Social Work Maternal (Signed)
    Clinical Social Work Department PSYCHOSOCIAL ASSESSMENT - MATERNAL/CHILD 10/01/2011  Patient:  Kelly Velez, Kelly Velez  Account Number:  [id number]  Admit Date:  09/29/2011  Marjo Bicker Name:   Kelly Velez    Clinical Social Worker:  Andy Gauss   Date/Time:  10/01/2011 12:07 PM  Date Referred:  10/01/2011   Referral source  CN     Referred reason  Other - See comment   Other referral source:    I:  FAMILY / HOME ENVIRONMENT Child's legal guardian:  PARENT  Guardian - Name Guardian - Age Guardian - Address  Jelisa Kimball [address]; [place], [place] [address]  Wyvonne Lenz 30 (same as above)   Other household support members/support persons Other support:   Aunts    II  PSYCHOSOCIAL DATA Information Source:  Patient Interview  Event organiser Employment:   Surveyor, quantity resources:  [employer] If Medicaid - County:  [employer] Other  Chemical engineer / Grade:   Maternity Care Coordinator / Child Services Coordination / Early Interventions:  Cultural issues impacting care:    III  STRENGTHS Strengths  Adequate Resources  Home prepared for Child (including basic supplies)  Supportive family/friends   Strength comment:    IV  RISK FACTORS AND CURRENT PROBLEMS Current Problem:  None   Risk Factor & Current Problem Patient Issue Family Issue Risk Factor / Current Problem Comment   N N     V  SOCIAL WORK ASSESSMENT Sw met with pt to assess current social situation.  Pt lives with the FOB.  She learned about pregnancy at 6 weeks but could not start [facility] until Medicaid was approved.  She denies any illegal substance use.  UDS and meconium collection pending.  She has all the necessary supplies for the infant and has a good support system.  No depression or SI history.  Pt appears to be appropriate.  She does not identify any Sw needs at this time.  Sw is available to assist further if needed.      VI SOCIAL WORK PLAN Social Work Plan  No Further  Intervention Required / No Barriers to Discharge   Type of pt/family education:   If child protective services report - county:   If child protective services report - date:   Information/referral to community resources comment:   Other social work plan:

## 2011-10-02 MED ORDER — IBUPROFEN 100 MG/5ML PO SUSP: 600.0000 mg | Freq: Four times a day (QID) | ORAL | Status: AC | PRN

## 2011-10-02 NOTE — Progress Notes (Signed)
Post Partum Day 2 Subjective: no complaints  Objective: Blood pressure 107/60, pulse 84, temperature 98.5 F (36.9 C), temperature source Oral, resp. rate 18, height 5\' 10"  (1.778 m), weight 92.08 kg (203 lb), SpO2 98.00%, unknown if currently breastfeeding.  Physical Exam:  General: alert and no distress Lochia: appropriate Uterine Fundus: firm Incision: healing well DVT Evaluation: No evidence of DVT seen on physical exam.  No results found for this basename: HGB:2,HCT:2 in the last 72 hours  Assessment/Plan: Discharge home   LOS: 3 days   Kelly Velez A 10/02/2011, 4:38 PM

## 2011-10-02 NOTE — Discharge Summary (Signed)
Obstetric Discharge Summary Reason for Admission: onset of labor Prenatal Procedures: ultrasound Intrapartum Procedures: spontaneous vaginal delivery Postpartum Procedures: none Complications-Operative and Postpartum: none Hemoglobin  Date Value Range Status  09/29/2011 9.0* 12.0 - 15.0 g/dL Final     HCT  Date Value Range Status  09/29/2011 27.8* 36.0 - 46.0 % Final    Physical Exam:  General: alert and no distress Lochia: appropriate Uterine Fundus: firm Incision: healing well DVT Evaluation: No evidence of DVT seen on physical exam.  Discharge Diagnoses: Term Pregnancy-delivered  Discharge Information: Date: 10/02/2011 Activity: pelvic rest Diet: routine Medications: PNV,  Ibuprofen                                                                                                                                                                                                                                                                                                                                                         Condition: stable Instructions: refer to practice specific booklet Discharge to: home Follow-up Information    Follow up with Sonny Anthes A, MD. Schedule an appointment as soon as possible for a visit in 6 weeks.   Contact information:   [address] [place] [address] [phone]          Newborn Data: Live born female  Birth Weight: 8 lb 5.3 oz (3779 g) APGAR: 9, 9  Home with mother.  Joshuajames Moehring A 10/02/2011, 4:45 PM

## 2012-12-14 IMAGING — US US OB COMP LESS 14 WK
1 series · 14 of 28 positions shown · non-contrast
Comparison: None.

CLINICAL DATA: Pain.  Estimated gestational age by LMP 6 weeks 5
days

OBSTETRIC <14 WK US AND TRANSVAGINAL OB US
TECHNIQUE: Both transabdominal and transvaginal ultrasound
examinations were performed for complete evaluation of the
gestation as well as the maternal uterus, adnexal regions, and
pelvic cul-de-sac.  Transvaginal technique was performed to assess
early pregnancy.

[Series 1: us ob comp less 14 wk · 0.23mm/px · 14 of 38 slices shown]
[im 2/38]
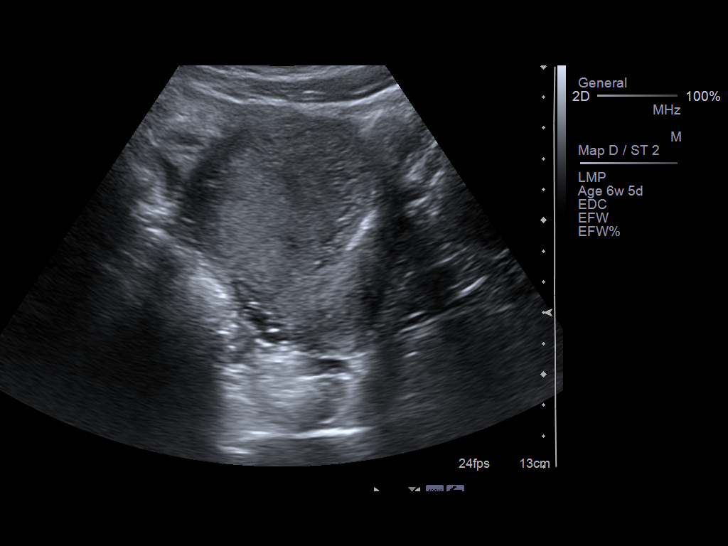
[im 5/38]
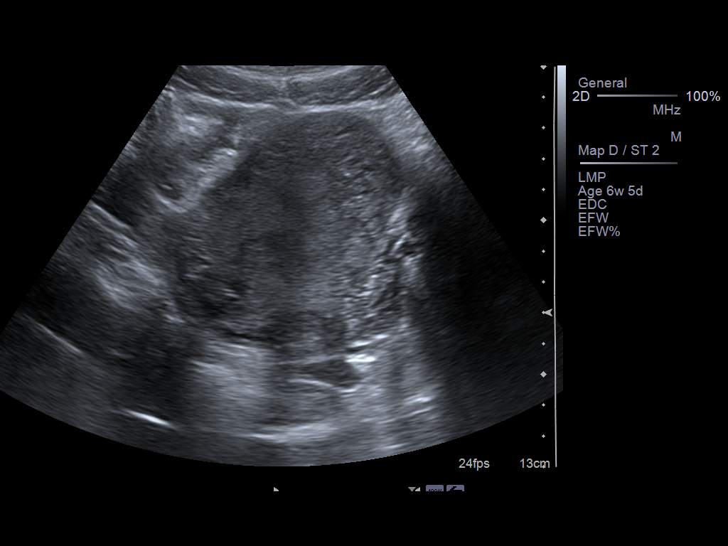
[im 7/38]
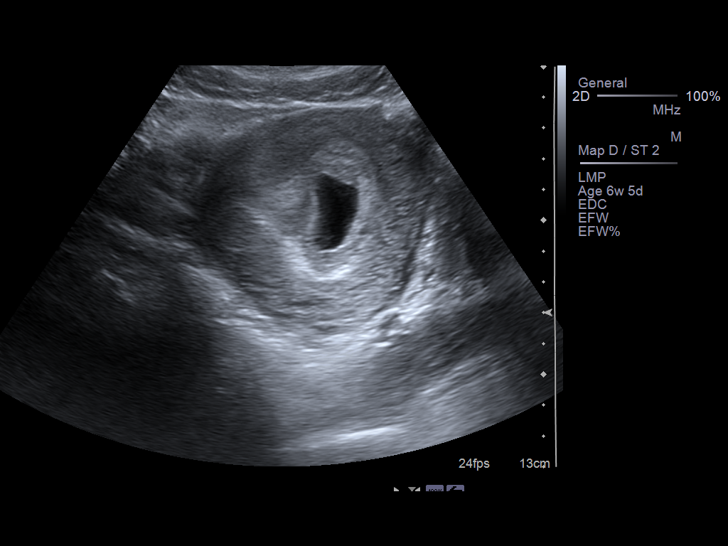
[im 10/38]
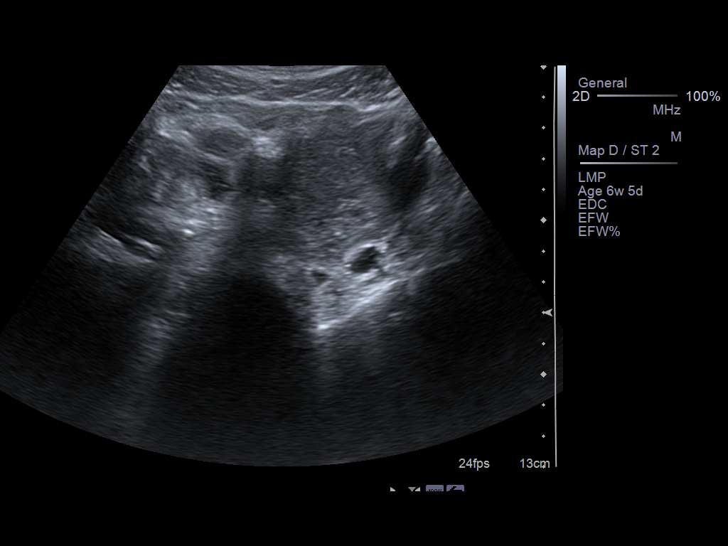
[im 13/38]
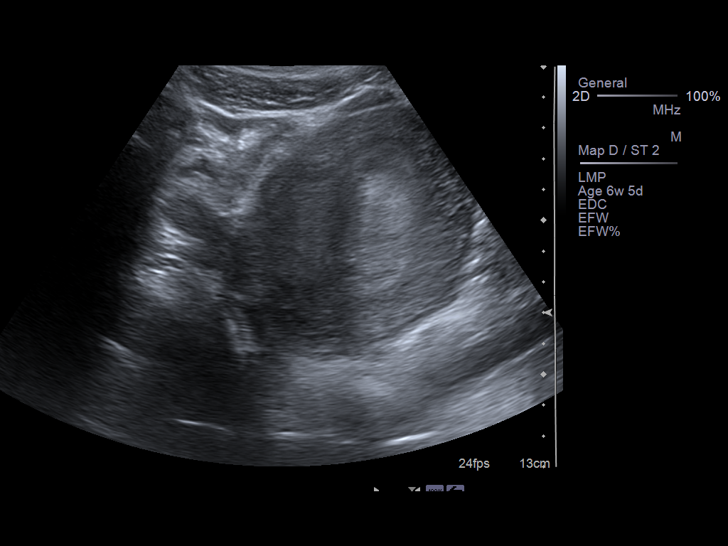
[im 16/38]
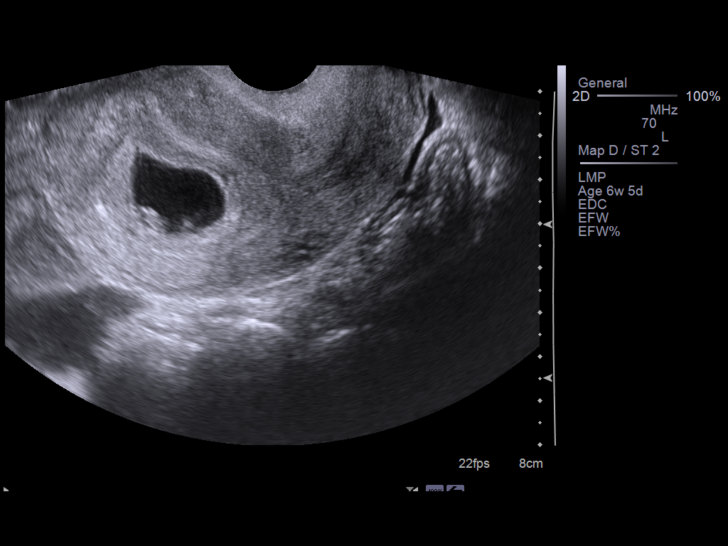
[im 18/38]
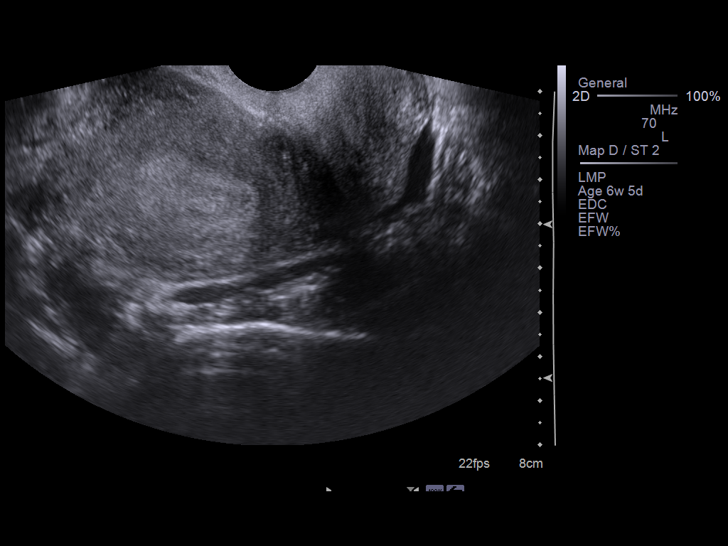
[im 21/38]
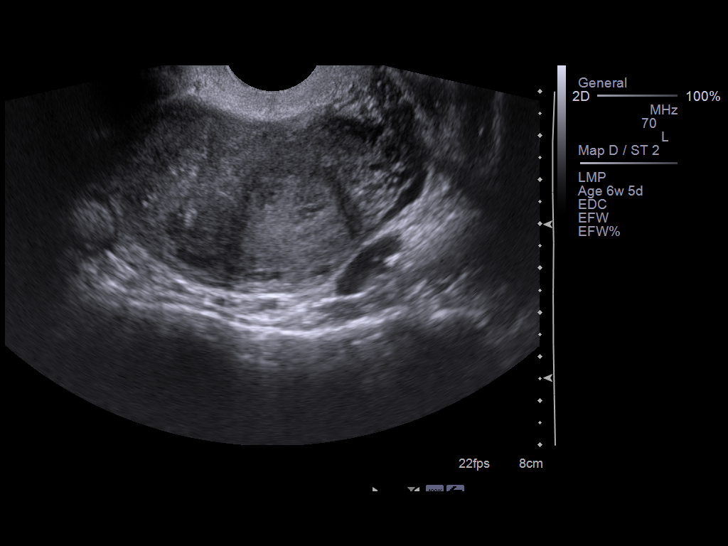
[im 24/38]
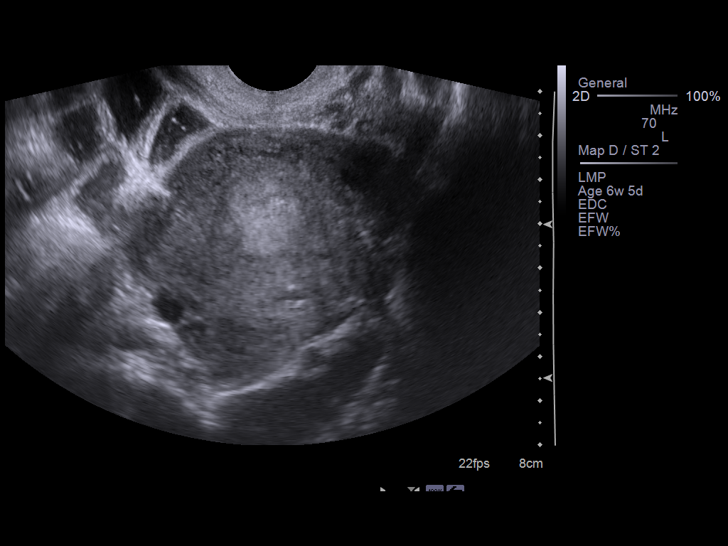
[im 27/38]
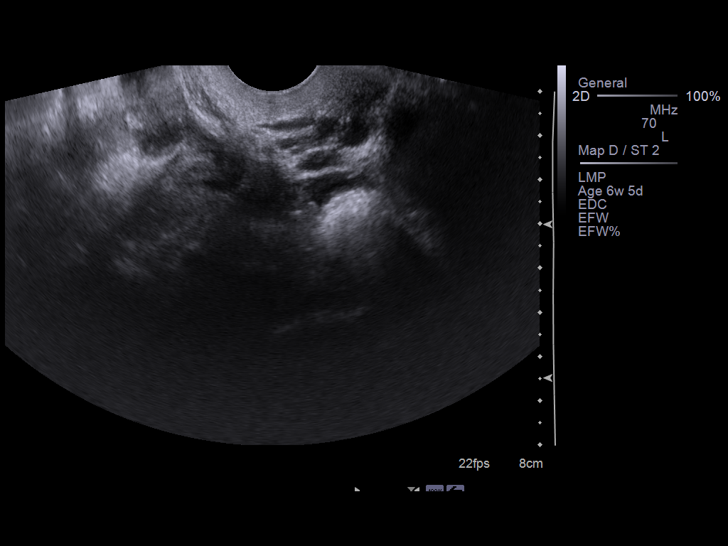
[im 29/38]
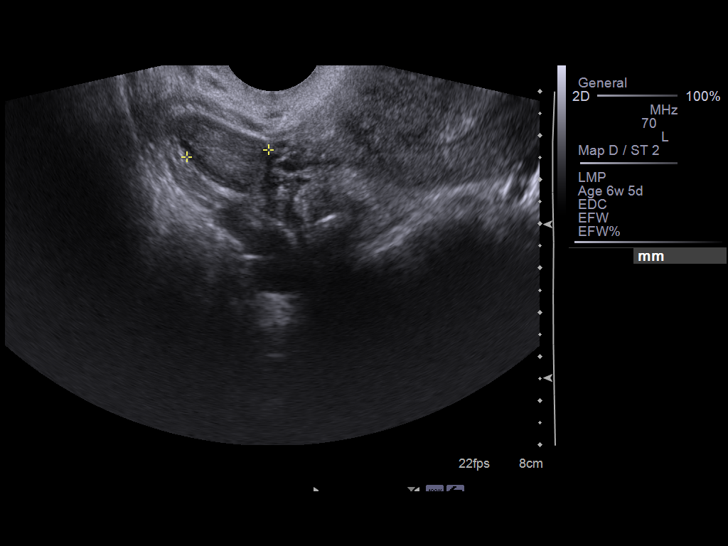
[im 32/38]
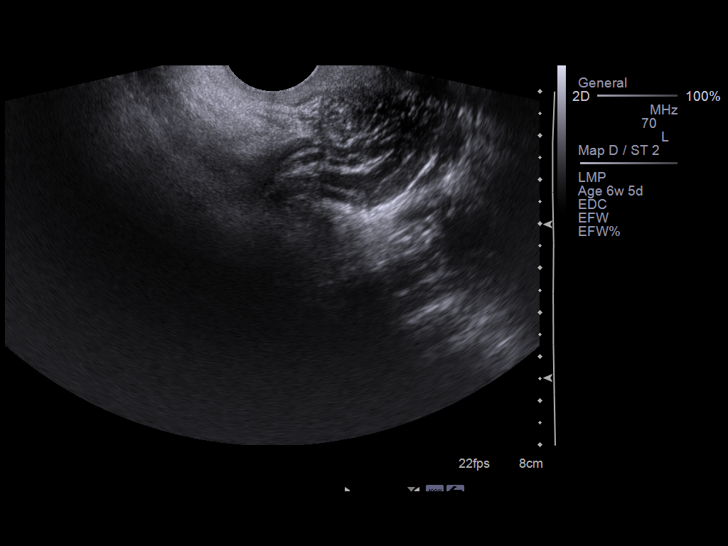
[im 35/38]
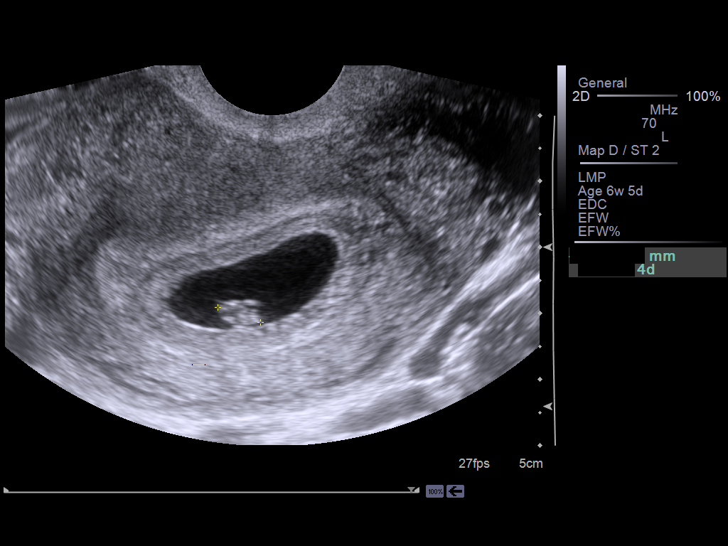
[im 38/38]
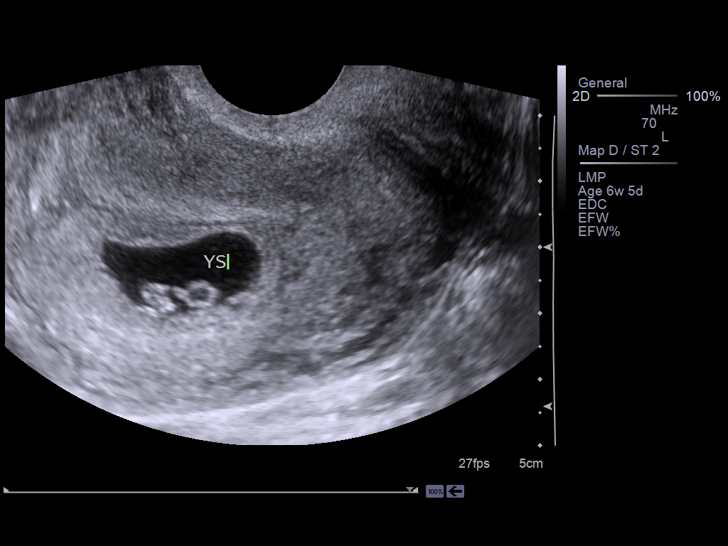

[14 of 28 positions shown; findings below may reference images not displayed]

Intrauterine gestational sac:  A single intrauterine gestational
sac is visualized in the uterine fundus.
Yolk sac: Present
Embryo: Present
Cardiac Activity: Demonstrated
Heart Rate: 135 bpm

CRL: 7.6   mm  6   w  5   d         US EDC: 09/24/2011

Maternal uterus/adnexae:
No myometrial masses demonstrated.  No subchorionic hemorrhage.
The right ovary measures 3.7 x 1.7 x 1.9 cm.  The left ovary
measures 3.3 x 2.1 x 3.2 cm.  Normal follicular changes
demonstrated.  No abnormal adnexal masses.  No free pelvic fluid
collections.
IMPRESSION: Single intrauterine pregnancy demonstrated with fetal pole and yolk
sac demonstrated.  Estimated gestational age by crown-rump length
is 6 weeks 5 days.

## 2013-05-05 IMAGING — US US OB TRANSVAGINAL
1 series · 12 of 28 positions shown · non-contrast
Comparison: none

[Series 1: us ob detail +14 wk · 92 acquisitions, 12 frames shown]
[im 4/92]
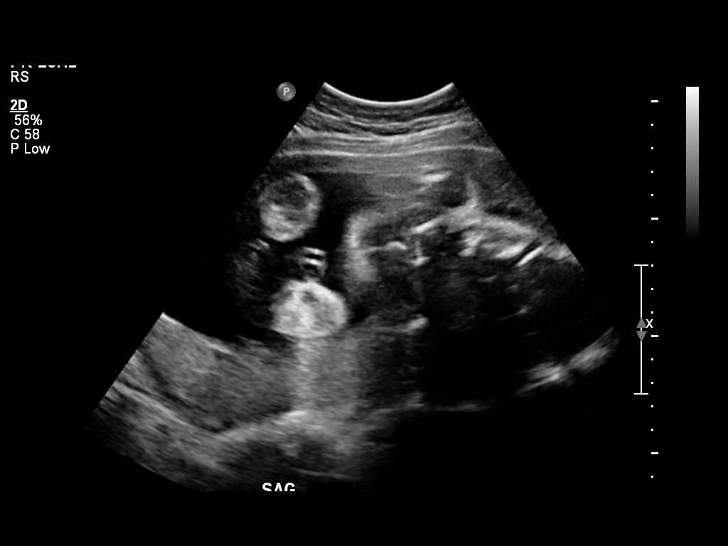
[im 11/92]
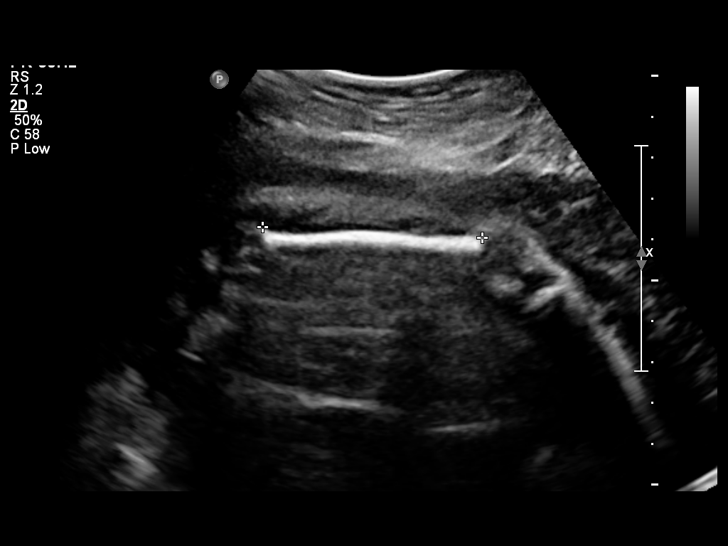
[im 17/92]
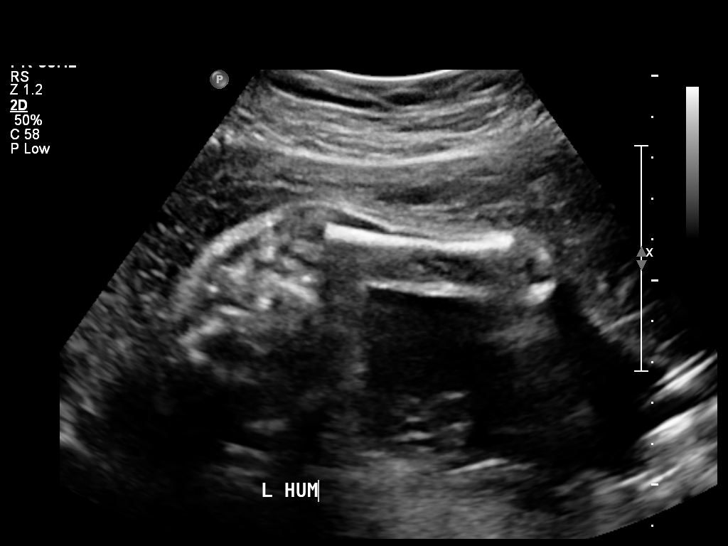
[im 27/92]
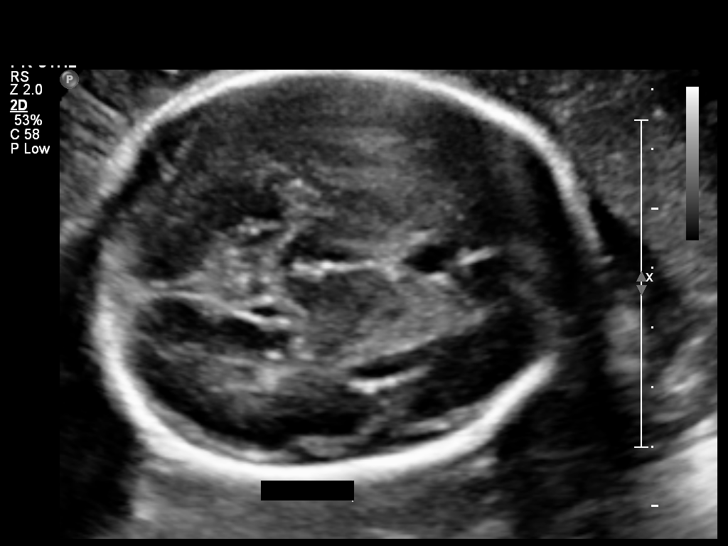
[im 34/92]
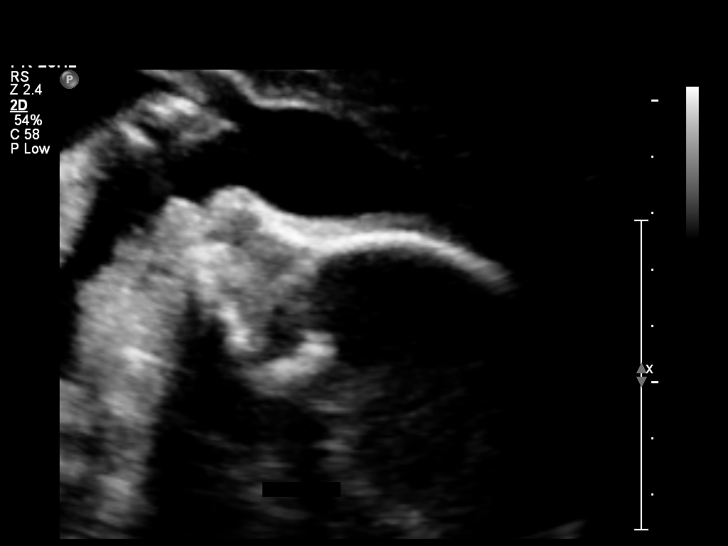
[im 41/92]
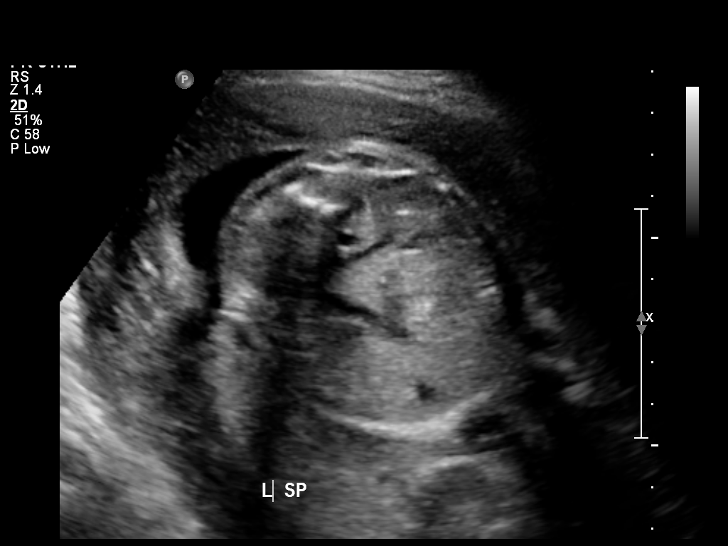
[im 51/92]
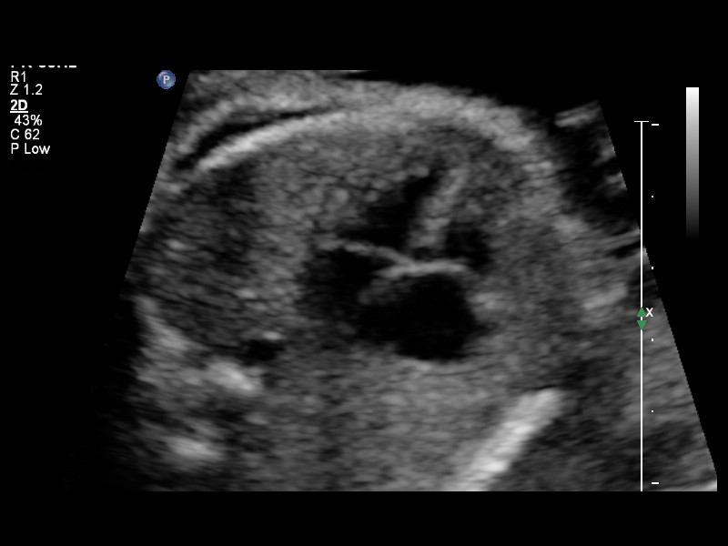
[im 58/92]
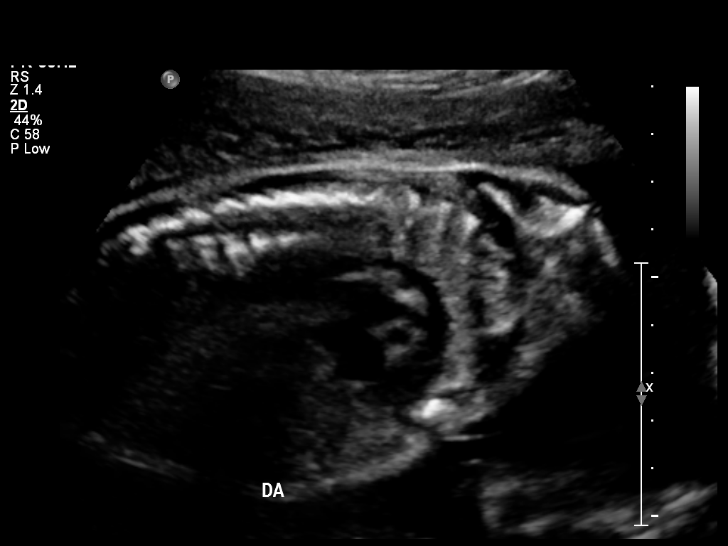
[im 65/92]
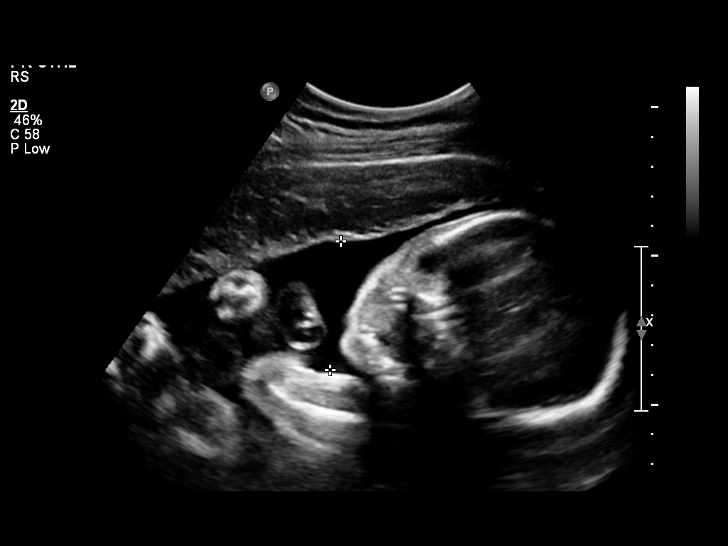
[im 75/92]
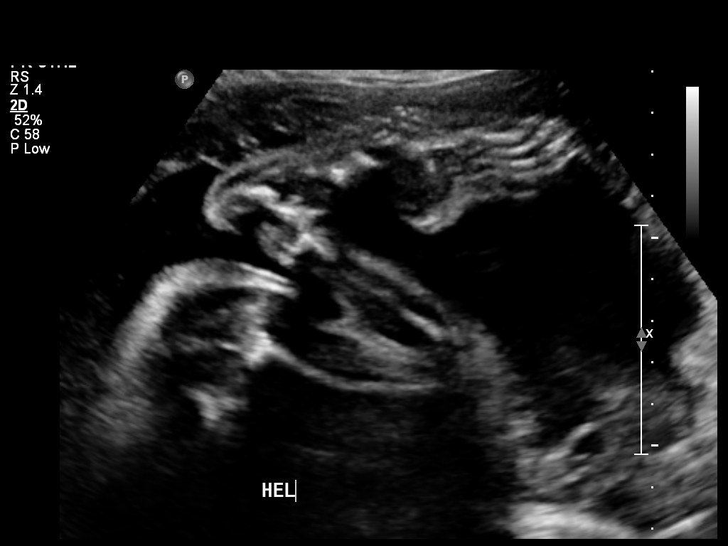
[im 81/92]
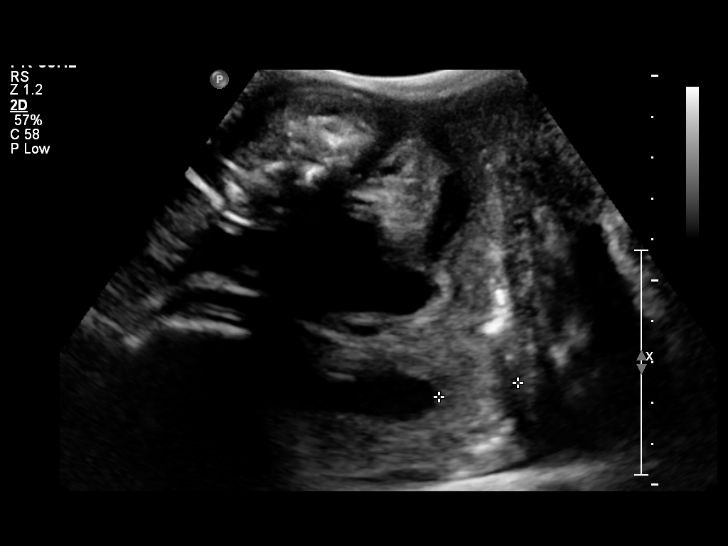
[im 88/92]
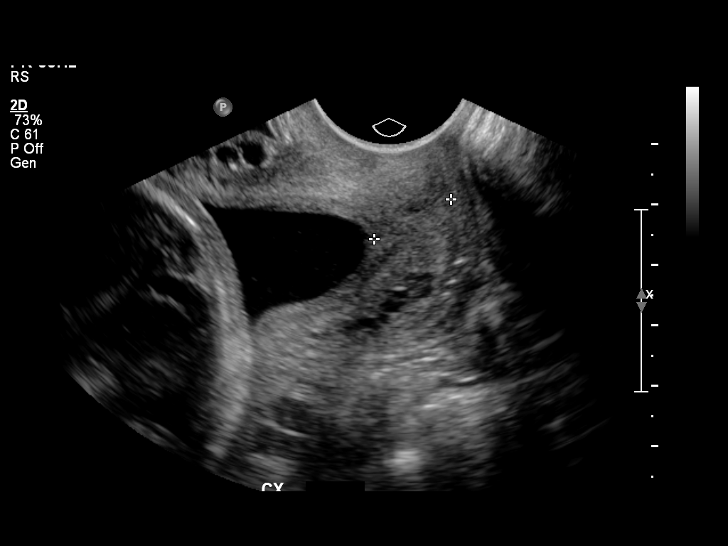

[12 of 28 positions shown; findings below may reference images not displayed]

OBSTETRICS REPORT
                      (Signed Final 06/25/2011 [DATE])

 Order#:         [id number]
                 07_O
Procedures

 US OB DETAIL + 14 WK                                  76811.0
 US OB TRANSVAGINAL                                    76817.0
Indications

 Detailed fetal anatomic survey
Fetal Evaluation

 Fetal Heart Rate:  144                         bpm
 Cardiac Activity:  Observed
 Presentation:      Cephalic
 Placenta:          Posterior, above cervical
                    os
 P. Cord            Visualized
 Insertion:

 Amniotic Fluid
 AFI FV:      Subjectively within normal limits
 AFI Sum:     15.98   cm      58   %Tile     Larg Pckt:     4.4  cm
 RUQ:   3.72   cm    RLQ:    3.54   cm    LUQ:   4.32    cm   LLQ:    4.4    cm
Biometry

 BPD:     66.5  mm    G. Age:   26w 6d                CI:        72.11   70 - 86
                                                      FL/HC:      21.5   18.6 -

 HC:     249.2  mm    G. Age:   27w 0d       24  %    HC/AC:      1.09   1.05 -

 AC:     228.6  mm    G. Age:   27w 2d       49  %    FL/BPD:     80.5   71 - 87
 FL:      53.5  mm    G. Age:   28w 3d       76  %    FL/AC:      23.4   20 - 24
 HUM:       46  mm    G. Age:   27w 1d       50  %

 Est. FW:    9050  gm      2 lb 7 oz     66  %
Gestational Age

 U/S Today:     27w 3d                                        EDD:   09/21/11
 Best:          27w 0d    Det. By:   Early Ultrasound         EDD:   09/24/11
Genetic Sonogram - Trisomy 21 Screening

 Age:                                             25          Risk=1:   885
 Echogenic bowel:                                 No          LR :
 Hypoplastic/absent Nasal bone:                   No
 Choroid plexus cysts:                            No
 Structural anomalies (inc. cardiac):             No          LR :
 Hypoplastic / absent midphalanx 5th Digit:       No
 Short femur:                                     No          LR :
 Wide space 9st-6nd toes:                         No
 Short humerus:                                   No          LR :
 2-vessel umbilical cord:                         No
 Pyelectasis:                                     No          LR :
 Echogenic cardiac foci:                          No          LR :

 11 Of 11 Criteria Were Visualized and 0 Abnormal(s) Were Seen.
 Ultrasound Modified Risk for Fetal Down Syndrome = [DATE]
Anatomy

 Cranium:           Appears normal      Aortic Arch:       Appears normal
 Fetal Cavum:       Appears normal      Ductal Arch:       Appears normal
 Ventricles:        Appears normal      Diaphragm:         Appears normal
 Choroid Plexus:    Appears normal      Stomach:           Appears normal
 Cerebellum:        Appears normal      Abdomen:           Appears normal
 Posterior Fossa:   Appears normal      Abdominal Wall:    Appears nml
                                                           (cord insert,
                                                           abd wall)
 Nuchal Fold:       Not applicable      Cord Vessels:      Appears normal
                    (>20 wks GA)                           (3 vessel cord)
 Face:              Appears normal      Kidneys:           Appear normal
                    (lips/profile/orbit
                    s)
 Heart:             Appears normal      Bladder:           Appears normal
                    (4 chamber &
                    axis)
 RVOT:              Appears normal      Spine:             Appears normal
 LVOT:              Appears normal      Limbs:             Appears normal
                                                           (hands, ankles,
                                                           feet)

 Other:     Female gender. Heels visualized.
Cervix Uterus Adnexa

 Cervical Length:   1.5       cm       Funnel Width:   1.8       cm
 Funnel Length:     2.3       cm

 Cervix:       Appears funnelled, see comments.
 Cul De Sac:   No free fluid seen.
 Left Ovary:   Previously seen.
 Right Ovary:  Previously seen

 Adnexa:     No abnormality visualized.
Impression

   Single living intrauterine gestation with concordant
 gestational age and normal visualized anatomy. No
 sonographic markers for aneuploidy visualized;  see above
 for US modified Trisomy 21 risk calculation.

   Short cervix, measuring 1.5 cm with funneling at the internal
 os.  The amniotic fluid volume remains normal.

 questions or concerns.

## 2014-02-19 ENCOUNTER — Encounter (HOSPITAL_COMMUNITY): Payer: Self-pay

## 2015-06-21 ENCOUNTER — Telehealth: Payer: Self-pay

## 2015-06-21 NOTE — Telephone Encounter (Signed)
medical records from [facility] faxed to [phone] [facility]
